# Patient Record
Sex: Male | Born: 1995 | Race: Black or African American | Hispanic: No | Marital: Single | Smoking: Never smoker
Health system: Southern US, Community
[De-identification: ages and names within clinical notes are randomized; demographics above are authoritative.]

---
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
## 2016-05-17 ENCOUNTER — Emergency Department (HOSPITAL_COMMUNITY): Payer: Medicaid Other

## 2016-05-17 ENCOUNTER — Emergency Department (HOSPITAL_COMMUNITY)
Admission: EM | Admit: 2016-05-17 | Discharge: 2016-05-18 | Disposition: A | Discharge status: Home / Self Care | Payer: Medicaid Other | Attending: Emergency Medicine | Admitting: Emergency Medicine

## 2016-05-17 ENCOUNTER — Encounter (HOSPITAL_COMMUNITY): Payer: Self-pay | Admitting: Emergency Medicine

## 2016-05-17 DIAGNOSIS — S43401A Unspecified sprain of right shoulder joint, initial encounter: Secondary | ICD-10-CM | POA: Insufficient documentation

## 2016-05-17 DIAGNOSIS — Y999 Unspecified external cause status: Secondary | ICD-10-CM | POA: Insufficient documentation

## 2016-05-17 DIAGNOSIS — S63501A Unspecified sprain of right wrist, initial encounter: Secondary | ICD-10-CM | POA: Diagnosis not present

## 2016-05-17 DIAGNOSIS — Y92410 Unspecified street and highway as the place of occurrence of the external cause: Secondary | ICD-10-CM | POA: Diagnosis not present

## 2016-05-17 DIAGNOSIS — Y939 Activity, unspecified: Secondary | ICD-10-CM | POA: Insufficient documentation

## 2016-05-17 DIAGNOSIS — S4991XA Unspecified injury of right shoulder and upper arm, initial encounter: Secondary | ICD-10-CM | POA: Diagnosis present

## 2016-05-17 MED ORDER — IBUPROFEN 200 MG PO TABS
600.0000 mg | ORAL_TABLET | Freq: Once | ORAL | Status: AC
  Administered 2016-05-17: 600 mg via ORAL

## 2016-05-17 MED ORDER — IBUPROFEN 200 MG PO TABS
ORAL_TABLET | ORAL | Status: AC
  Filled 2016-05-17: qty 3
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
## 2018-03-24 ENCOUNTER — Encounter (HOSPITAL_COMMUNITY): Payer: Self-pay

## 2018-03-24 ENCOUNTER — Other Ambulatory Visit: Payer: Self-pay

## 2018-03-24 ENCOUNTER — Emergency Department (HOSPITAL_COMMUNITY)
Admission: EM | Admit: 2018-03-24 | Discharge: 2018-03-25 | Disposition: A | Discharge status: Home / Self Care | Payer: Self-pay | Attending: Emergency Medicine | Admitting: Emergency Medicine

## 2018-03-24 DIAGNOSIS — F419 Anxiety disorder, unspecified: Secondary | ICD-10-CM | POA: Insufficient documentation

## 2018-03-24 LAB — CBC WITH DIFFERENTIAL/PLATELET
ABS IMMATURE GRANULOCYTES: 0.02 10*3/uL (ref 0.00–0.07)
BASOS ABS: 0 10*3/uL (ref 0.0–0.1)
Basophils Relative: 1 %
Eosinophils Absolute: 0 10*3/uL (ref 0.0–0.5)
Eosinophils Relative: 1 %
HEMATOCRIT: 45.8 % (ref 39.0–52.0)
HEMOGLOBIN: 14.9 g/dL (ref 13.0–17.0)
IMMATURE GRANULOCYTES: 0 %
LYMPHS PCT: 26 %
Lymphs Abs: 1.8 10*3/uL (ref 0.7–4.0)
MCH: 28.5 pg (ref 26.0–34.0)
MCHC: 32.5 g/dL (ref 30.0–36.0)
MCV: 87.7 fL (ref 80.0–100.0)
Monocytes Absolute: 0.6 10*3/uL (ref 0.1–1.0)
Monocytes Relative: 8 %
NEUTROS ABS: 4.5 10*3/uL (ref 1.7–7.7)
NEUTROS PCT: 64 %
NRBC: 0 % (ref 0.0–0.2)
Platelets: 235 10*3/uL (ref 150–400)
RBC: 5.22 MIL/uL (ref 4.22–5.81)
RDW: 12.4 % (ref 11.5–15.5)
WBC: 7 10*3/uL (ref 4.0–10.5)

## 2018-03-24 LAB — URINALYSIS, ROUTINE W REFLEX MICROSCOPIC
Bilirubin Urine: NEGATIVE
GLUCOSE, UA: NEGATIVE mg/dL
Hgb urine dipstick: NEGATIVE
Ketones, ur: 5 mg/dL — AB
LEUKOCYTES UA: NEGATIVE
Nitrite: NEGATIVE
Protein, ur: NEGATIVE mg/dL
SPECIFIC GRAVITY, URINE: 1.029 (ref 1.005–1.030)
pH: 6 (ref 5.0–8.0)

## 2018-03-24 LAB — COMPREHENSIVE METABOLIC PANEL
ALT: 36 U/L (ref 0–44)
ANION GAP: 6 (ref 5–15)
AST: 36 U/L (ref 15–41)
Albumin: 4.3 g/dL (ref 3.5–5.0)
Alkaline Phosphatase: 68 U/L (ref 38–126)
BUN: 11 mg/dL (ref 6–20)
CHLORIDE: 106 mmol/L (ref 98–111)
CO2: 24 mmol/L (ref 22–32)
CREATININE: 1.02 mg/dL (ref 0.61–1.24)
Calcium: 9.4 mg/dL (ref 8.9–10.3)
Glucose, Bld: 105 mg/dL — ABNORMAL HIGH (ref 70–99)
Potassium: 3.8 mmol/L (ref 3.5–5.1)
Sodium: 136 mmol/L (ref 135–145)
Total Bilirubin: 0.7 mg/dL (ref 0.3–1.2)
Total Protein: 6.9 g/dL (ref 6.5–8.1)

## 2018-03-24 LAB — SALICYLATE LEVEL: Salicylate Lvl: <7 mg/dL (ref 2.8–30.0)

## 2018-03-24 LAB — RAPID URINE DRUG SCREEN, HOSP PERFORMED
AMPHETAMINES: NOT DETECTED
BARBITURATES: NOT DETECTED
Benzodiazepines: NOT DETECTED
COCAINE: NOT DETECTED
Opiates: NOT DETECTED
TETRAHYDROCANNABINOL: POSITIVE — AB

## 2018-03-24 LAB — ACETAMINOPHEN LEVEL: Acetaminophen (Tylenol), Serum: <10 ug/mL — ABNORMAL LOW (ref 10–30)

## 2018-03-24 LAB — ETHANOL

## 2018-03-24 NOTE — ED Triage Notes (Signed)
Pt with ems for c.o panic attack. pts mother picked him up from work, states he was not "acting right", starting driving here but then called ems. Pt denies any drug use, given 5mg  Iv haldol en route and pt now calm. nad noted pt alert and oriented   BP150/60 HR74 [id number]

## 2018-03-24 NOTE — ED Notes (Signed)
ED Provider at bedside. 

## 2018-03-24 NOTE — ED Provider Notes (Signed)
[facility] EMERGENCY DEPARTMENT Provider Note   CSN: [id number] Arrival date & time: 03/24/18  2027     History   Chief Complaint Chief Complaint  Patient presents with  . Panic Attack    HPI Aaron Walters is a 22 y.o. male.  HPI   Aaron Walters is a 22yo male with no significant past medical history who presents to the emergency department via EMS for evaluation of "not acting right."  Patient reports that he had a panic attack today, no prior history of this.  He reports that he was at work behind [employer] at approximately 7:30 PM this evening when he suddenly felt overwhelmed, anxious, lightheaded, shaky.  He states that he called his mother who came to pick him up and she noticed that he just was not "acting right" and called EMS.  He received 5 mg IV Haldol in route and reports that he now feels baseline.  He denies SI/HI.  Denies visual or auditory hallucinations.  He reports occasional recreational marijuana use, none today.  Denies alcohol use.  He denies fevers, chills, chest pain, palpitations, shortness of breath, leg swelling, sore throat, cough, congestion, abdominal pain, nausea/vomiting, dysuria, syncope.  He denies any recent trigger that would have made him anxious recently.  Has never been on any psychiatric medications.  History reviewed. No pertinent past medical history.  There are no active problems to display for this patient.   History reviewed. No pertinent surgical history.      Home Medications    Prior to Admission medications   Not on File    Family History No family history on file.  Social History Social History   Tobacco Use  . Smoking status: Never Smoker  Substance Use Topics  . Alcohol use: No  . Drug use: Yes    Types: Marijuana     Allergies   Patient has no known allergies.   Review of Systems Review of Systems  Constitutional: Negative for chills and fever.  HENT: Negative for  congestion and sore throat.   Eyes: Negative for visual disturbance.  Respiratory: Negative for shortness of breath.   Cardiovascular: Negative for chest pain, palpitations and leg swelling.  Gastrointestinal: Negative for abdominal pain, nausea and vomiting.  Genitourinary: Negative for difficulty urinating and dysuria.  Musculoskeletal: Negative for gait problem.  Skin: Negative for rash.  Neurological: Positive for light-headedness. Negative for weakness, numbness and headaches.  Psychiatric/Behavioral: Positive for agitation. Negative for hallucinations and suicidal ideas. The patient is nervous/anxious.      Physical Exam Updated Vital Signs BP 118/79   Pulse (!) 101   Temp 98.4 F (36.9 C) (Oral)   Resp (!) 27   SpO2 100%   Physical Exam  Constitutional: He appears well-developed and well-nourished. No distress.  Laying at bedside, appears comfortable. NAD.   HENT:  Head: Normocephalic and atraumatic.  Mouth/Throat: Oropharynx is clear and moist.  Eyes: Pupils are equal, round, and reactive to light. Conjunctivae are normal. Right eye exhibits no discharge. Left eye exhibits no discharge.  Neck: Normal range of motion. Neck supple.  Cardiovascular: Normal rate and regular rhythm.  Pulmonary/Chest: Effort normal and breath sounds normal. No stridor. No respiratory distress. He has no wheezes. He has no rales.  Abdominal: Soft. Bowel sounds are normal. There is no tenderness.  Musculoskeletal: Normal range of motion.  No leg swelling.   Neurological: He is alert. Coordination normal.  Mental Status:  Alert, oriented, thought  content appropriate, able to give a coherent history. Speech fluent without evidence of aphasia. Able to follow 2 step commands without difficulty.  Cranial Nerves:  II:  Peripheral visual fields grossly normal, pupils equal, round, reactive to light III,IV, VI: ptosis not present, extra-ocular motions intact bilaterally  V,VII: smile symmetric,  facial light touch sensation equal VIII: hearing grossly normal to voice  X: uvula elevates symmetrically  XI: bilateral shoulder shrug symmetric and strong XII: midline tongue extension without fassiculations Motor:  Normal tone. 5/5 in upper and lower extremities bilaterally including strong and equal grip strength and dorsiflexion/plantar flexion Sensory: Pinprick and light touch normal in all extremities.  Cerebellar: normal finger-to-nose with bilateral upper extremities Gait: normal gait and balance CV: distal pulses palpable throughout   Skin: Skin is warm and dry. He is not diaphoretic.  Psychiatric: He has a normal mood and affect. His behavior is normal.  Appears well kept.  Voice appropriate speed and volume.  Attention good, able to spell world backwards.  Denies SI/HI.  No apparent delusions or hallucinations.  Nursing note and vitals reviewed.    ED Treatments / Results  Labs (all labs ordered are listed, but only abnormal results are displayed) Labs Reviewed  COMPREHENSIVE METABOLIC PANEL - Abnormal; Notable for the following components:      Result Value   Glucose, Bld 105 (*)    All other components within normal limits  ACETAMINOPHEN LEVEL - Abnormal; Notable for the following components:   Acetaminophen (Tylenol), Serum <10 (*)    All other components within normal limits  RAPID URINE DRUG SCREEN, HOSP PERFORMED - Abnormal; Notable for the following components:   Tetrahydrocannabinol POSITIVE (*)    All other components within normal limits  URINALYSIS, ROUTINE W REFLEX MICROSCOPIC - Abnormal; Notable for the following components:   Ketones, ur 5 (*)    All other components within normal limits  ETHANOL  CBC WITH DIFFERENTIAL/PLATELET  SALICYLATE LEVEL    EKG EKG Interpretation  Date/Time:  Tuesday March 24 2018 21:35:03 EDT Ventricular Rate:  68 PR Interval:  158 QRS Duration: 96 QT Interval:  436 QTC Calculation: 463 R Axis:   88 Text  Interpretation:  Normal sinus rhythm Normal ECG Confirmed by Tilden Fossa [phone]) on 03/24/2018 9:36:54 PM   Radiology No results found.  Procedures Procedures (including critical care time)  Medications Ordered in ED Medications - No data to display   Initial Impression / Assessment and Plan / ED Course  I have reviewed the triage vital signs and the nursing notes.  Pertinent labs & imaging results that were available during my care of the patient were reviewed by me and considered in my medical decision making (see chart for details).     Patient presents with transient period of feeling overwhelmed, anxious, shaky, lightheaded today. His mother states he just wasn't "acting right." He got 5mg  haldol via EMS en route. States he feels baseline. No complaints. Denies SI/HI. No apparent delusions or hallucinations. No other medical problems, no prior psychiatric history. On exam he has normal affect. Responds to questions appropriately. Good attention. No neurological deficits.   EKG NSR. Labs including CMP, CBC, UA unremarkable. Salicylate, acetaminophen and ethanol level negative. Rapid UDS positive for THC.   Symptoms consistent with anxiety. No SI/HI or active delusions or hallucinations. I do not think that patient requires IVC. Symptoms inconsistent with seizure. Plan to discharge patient with outpatient resources to f/u with PCP. I will also discharge with hydroxyzine PRN. Discussed  return precautions and he and his mother at bedside agree and appear reliable.    Final Clinical Impressions(s) / ED Diagnoses   Final diagnoses:  Anxiety    ED Discharge Orders         Ordered    hydrOXYzine (ATARAX/VISTARIL) 25 MG tablet  Every 6 hours PRN     03/25/18 0007           Kellie Shropshire, PA-C 03/25/18 0008    Tilden Fossa, MD 03/25/18 [id number]

## 2018-03-25 MED ORDER — HYDROXYZINE HCL 25 MG PO TABS: 25.0000 mg | ORAL_TABLET | Freq: Four times a day (QID) | ORAL | 0 refills | Status: AC | PRN

## 2018-03-25 NOTE — Discharge Instructions (Signed)
Please call and make an appointment with [facility] (across the street from [facility] [place]) to establish care.   I have also attached outpatient psychiatry resources.   He can take hydroxyzine as needed for anxiety.   Return to the ER for any new or concerning symptoms like thoughts of hurting himself or others, seeing or hearing things that others do not see your hear.

## 2018-09-05 ENCOUNTER — Encounter (HOSPITAL_COMMUNITY): Payer: Self-pay

## 2018-09-05 ENCOUNTER — Ambulatory Visit (HOSPITAL_COMMUNITY)
Admission: EM | Admit: 2018-09-05 | Discharge: 2018-09-05 | Disposition: A | Discharge status: Home / Self Care | Payer: Self-pay | Attending: Physician Assistant | Admitting: Physician Assistant

## 2018-09-05 ENCOUNTER — Other Ambulatory Visit: Payer: Self-pay

## 2018-09-05 DIAGNOSIS — R05 Cough: Secondary | ICD-10-CM

## 2018-09-05 DIAGNOSIS — R059 Cough, unspecified: Secondary | ICD-10-CM

## 2018-09-05 MED ORDER — AMOXICILLIN-POT CLAVULANATE 875-125 MG PO TABS: 1.0000 | ORAL_TABLET | Freq: Two times a day (BID) | ORAL | 0 refills | Status: AC

## 2018-09-05 NOTE — ED Provider Notes (Signed)
09/05/2018 11:51 AM   DOB: 07/31/1995 / MRN: [id number]  SUBJECTIVE:  Aaron Walters is a 23 y.o. male presenting for mild worsening cough and SOB that started 3 days ago.   Assoicates chills, myalgia, rhinorrhea.  Denies fever.  Has tried nothing.   He has No Known Allergies.   He  has no past medical history on file.    He  reports that he has never smoked. He has never used smokeless tobacco. He reports current drug use. Drug: Marijuana. He reports that he does not drink alcohol. He  has no history on file for sexual activity. The patient  has no past surgical history on file.  His family history is not on file.  ROS Per HPI  OBJECTIVE:  BP 132/83 (BP Location: Left Arm)   Pulse 60   Temp 98.2 F (36.8 C) (Oral)   Resp 16   SpO2 100%   Wt Readings from Last 3 Encounters:  05/17/16 175 lb (79.4 kg) (78 %, Z= 0.77)*  05/29/13 155 lb 7 oz (70.5 kg) (76 %, Z= 0.69)*   * Growth percentiles are based on CDC (Boys, 2-20 Years) data.   Temp Readings from Last 3 Encounters:  09/05/18 98.2 F (36.8 C) (Oral)  03/24/18 98.4 F (36.9 C) (Oral)  05/17/16 98.4 F (36.9 C) (Oral)   BP Readings from Last 3 Encounters:  09/05/18 132/83  03/25/18 120/82  05/18/16 117/72   Pulse Readings from Last 3 Encounters:  09/05/18 60  03/25/18 66  05/18/16 66    Physical Exam Vitals signs and nursing note reviewed.  Constitutional:      General: He is not in acute distress.    Appearance: He is well-developed. He is not ill-appearing, toxic-appearing or diaphoretic.  Eyes:     Conjunctiva/sclera: Conjunctivae normal.     Pupils: Pupils are equal, round, and reactive to light.  Cardiovascular:     Rate and Rhythm: Normal rate and regular rhythm.     Pulses: Normal pulses.     Heart sounds: Normal heart sounds, S1 normal and S2 normal. No murmur. No friction rub. No gallop.   Pulmonary:     Effort: Pulmonary effort is normal. No respiratory distress.     Breath sounds: No stridor. No  wheezing or rales.  Abdominal:     General: There is no distension.  Musculoskeletal: Normal range of motion.  Skin:    General: Skin is warm and dry.  Neurological:     Mental Status: He is alert and oriented to person, place, and time.     Cranial Nerves: No cranial nerve deficit.     Coordination: Coordination normal.     No results found for this or any previous visit (from the past 72 hour(s)).  No results found.  ASSESSMENT AND PLAN:   Cough - Will cover for atypical bacterial process. Augmenting sent.  Amid the concerns for covid advised patient immediately return home and self isolate.  Fortunately vital signs are excellent today. If SOB becomes moderate to severe advised he go to the ED.  He will avoid NSAIDS and stick with tylenol.   Discharge Instructions   None        The patient is advised to call or return to clinic if he does not see an improvement in symptoms, or to seek the care of the closest emergency department if he worsens with the above plan.   Deliah Boston, MHS, PA-C 09/05/2018 11:51 AM   Ofilia Neas,  PA-C 09/05/18 1153

## 2018-09-05 NOTE — ED Triage Notes (Signed)
Pt presents today with SOB, sore throat, body aches, chills, feels weak, unsure about fever, productive cough has been going on for 2 days.

## 2018-12-11 ENCOUNTER — Encounter (HOSPITAL_COMMUNITY): Payer: Self-pay

## 2018-12-11 ENCOUNTER — Ambulatory Visit (HOSPITAL_COMMUNITY)
Admission: EM | Admit: 2018-12-11 | Discharge: 2018-12-11 | Disposition: A | Discharge status: Home / Self Care | Payer: Self-pay | Attending: Family Medicine | Admitting: Family Medicine

## 2018-12-11 ENCOUNTER — Other Ambulatory Visit: Payer: Self-pay

## 2018-12-11 DIAGNOSIS — Z202 Contact with and (suspected) exposure to infections with a predominantly sexual mode of transmission: Secondary | ICD-10-CM

## 2018-12-11 DIAGNOSIS — Z113 Encounter for screening for infections with a predominantly sexual mode of transmission: Secondary | ICD-10-CM

## 2018-12-11 MED ORDER — AZITHROMYCIN 250 MG PO TABS
ORAL_TABLET | ORAL | Status: AC
  Filled 2018-12-11: qty 4

## 2018-12-11 MED ORDER — AZITHROMYCIN 250 MG PO TABS
1000.0000 mg | ORAL_TABLET | Freq: Once | ORAL | Status: AC
  Administered 2018-12-11: 10:00:00 1000 mg via ORAL

## 2018-12-11 NOTE — ED Triage Notes (Signed)
Patient presents to Urgent Care with complaints of a partner testing positive for chlamydia and wanting treatment today. pt states he does not want HIV testing today.

## 2018-12-11 NOTE — Discharge Instructions (Addendum)
We did lab testing during this visit.  If there are any abnormal findings that require change in medicine or indicate a positive result, you will be notified.  If all of your tests are normal, you will not be called.   ° °

## 2018-12-11 NOTE — ED Provider Notes (Signed)
[facility]    CSN: [id number] Arrival date & time: 12/11/18  0844      History   Chief Complaint Chief Complaint  Patient presents with  . Exposure to STD    HPI Aaron Walters is a 23 y.o. male.   HPI  Was just notified by his sexual partner that she has chlamydia.  He is here requesting treatment.  He has no symptoms.  We discussed safe sex.  History reviewed. No pertinent past medical history.  There are no active problems to display for this patient.   History reviewed. No pertinent surgical history.     Home Medications    Prior to Admission medications   Medication Sig Start Date End Date Taking? Authorizing Provider  amoxicillin-clavulanate (AUGMENTIN) 875-125 MG tablet Take 1 tablet by mouth every 12 (twelve) hours. 09/05/18   Tereasa Coop, PA-C  hydrOXYzine (ATARAX/VISTARIL) 25 MG tablet Take 1 tablet (25 mg total) by mouth every 6 (six) hours as needed for anxiety. 03/25/18   Glyn Ade, PA-C    Family History Family History  Problem Relation Age of Onset  . Healthy Mother   . Healthy Father     Social History Social History   Tobacco Use  . Smoking status: Never Smoker  . Smokeless tobacco: Never Used  Substance Use Topics  . Alcohol use: Yes    Comment: socially  . Drug use: Yes    Types: Marijuana    Comment: occasionally     Allergies   Patient has no known allergies.   Review of Systems Review of Systems  Constitutional: Negative for chills and fever.  HENT: Negative for ear pain and sore throat.   Eyes: Negative for pain and visual disturbance.  Respiratory: Negative for cough and shortness of breath.   Cardiovascular: Negative for chest pain and palpitations.  Gastrointestinal: Negative for abdominal pain and vomiting.  Genitourinary: Negative for dysuria and hematuria.  Musculoskeletal: Negative for arthralgias and back pain.  Skin: Negative for color change and rash.  Neurological: Negative for  seizures and syncope.  All other systems reviewed and are negative.    Physical Exam Triage Vital Signs ED Triage Vitals  Enc Vitals Group     BP 12/11/18 0926 112/80     Pulse Rate 12/11/18 0926 93     Resp 12/11/18 0926 17     Temp 12/11/18 0926 98.8 F (37.1 C)     Temp Source 12/11/18 0926 Oral     SpO2 12/11/18 0926 98 %     Weight --      Height --      Head Circumference --      Peak Flow --      Pain Score 12/11/18 0924 0     Pain Loc --      Pain Edu? --      Excl. in [place]? --    No data found.  Updated Vital Signs BP 112/80 (BP Location: Right Arm)   Pulse 93   Temp 98.8 F (37.1 C) (Oral)   Resp 17   SpO2 98%       Physical Exam Constitutional:      General: He is not in acute distress.    Appearance: He is well-developed.  HENT:     Head: Normocephalic and atraumatic.  Eyes:     Conjunctiva/sclera: Conjunctivae normal.     Pupils: Pupils are equal, round, and reactive to light.  Neck:     Musculoskeletal:  Normal range of motion.  Cardiovascular:     Rate and Rhythm: Normal rate.  Pulmonary:     Effort: Pulmonary effort is normal. No respiratory distress.  Abdominal:     General: There is no distension.     Palpations: Abdomen is soft.  Musculoskeletal: Normal range of motion.  Skin:    General: Skin is warm and dry.  Neurological:     Mental Status: He is alert.      UC Treatments / Results  Labs (all labs ordered are listed, but only abnormal results are displayed) Labs Reviewed  URINE CYTOLOGY ANCILLARY ONLY    EKG None  Radiology No results found.  Procedures Procedures (including critical care time)  Medications Ordered in UC Medications  azithromycin (ZITHROMAX) tablet 1,000 mg (1,000 mg Oral Given 12/11/18 0955)  azithromycin (ZITHROMAX) 250 MG tablet (has no administration in time range)    Initial Impression / Assessment and Plan / UC Course  I have reviewed the triage vital signs and the nursing notes.   Pertinent labs & imaging results that were available during my care of the patient were reviewed by me and considered in my medical decision making (see chart for details).     I recommended testing for HIV and syphilis.  Patient declines.  I recommended treatment with Rocephin and azithromycin.  Patient declines.  He does agree to testing for GC, chlamydia, trichomonas.  He will come back for treatment if indicated Final Clinical Impressions(s) / UC Diagnoses   Final diagnoses:  STD exposure     Discharge Instructions     We did lab testing during this visit.  If there are any abnormal findings that require change in medicine or indicate a positive result, you will be notified.  If all of your tests are normal, you will not be called.      ED Prescriptions    None     Controlled Substance Prescriptions [place] Controlled Substance Registry consulted? Not Applicable   Eustace MooreNelson, Reyaan Thoma Sue, MD 12/11/18 2007

## 2018-12-15 LAB — URINE CYTOLOGY ANCILLARY ONLY
Chlamydia: POSITIVE — AB
Neisseria Gonorrhea: NEGATIVE
Trichomonas: NEGATIVE

## 2018-12-16 ENCOUNTER — Telehealth (HOSPITAL_COMMUNITY): Payer: Self-pay | Admitting: Emergency Medicine

## 2018-12-16 NOTE — Telephone Encounter (Signed)
Chlamydia is positive.  This was treated at the urgent care visit with po zithromax 1g.  Pt needs education to please refrain from sexual intercourse for 7 days to give the medicine time to work.  Sexual partners need to be notified and tested/treated.  Condoms may reduce risk of reinfection.  Recheck or followup with PCP for further evaluation if symptoms are not improving.  [facility] notified.  Attempted to reach patient. No answer at this time. No voicemail set up.    

## 2018-12-17 ENCOUNTER — Telehealth (HOSPITAL_COMMUNITY): Payer: Self-pay | Admitting: Emergency Medicine

## 2018-12-17 NOTE — Telephone Encounter (Signed)
Attempted to reach patient x2. No answer at this time. No voicemail set up.   

## 2018-12-21 ENCOUNTER — Telehealth (HOSPITAL_COMMUNITY): Payer: Self-pay | Admitting: Emergency Medicine

## 2018-12-21 NOTE — Telephone Encounter (Signed)
Attempted to reach patient x3. No answer at this time. No voicemail set up. Letter sent
